# Patient Record
Sex: Female | Born: 1995 | Race: Black or African American | Hispanic: No | Marital: Single | Smoking: Never smoker
Health system: Southern US, Community
[De-identification: ages and names within clinical notes are randomized; demographics above are authoritative.]

## PROBLEM LIST (undated history)

## (undated) HISTORY — PX: SPINAL FUSION: SHX223

---

## 2018-07-18 ENCOUNTER — Emergency Department (HOSPITAL_BASED_OUTPATIENT_CLINIC_OR_DEPARTMENT_OTHER): Payer: BLUE CROSS/BLUE SHIELD

## 2018-07-18 ENCOUNTER — Emergency Department (HOSPITAL_BASED_OUTPATIENT_CLINIC_OR_DEPARTMENT_OTHER)
Admission: EM | Admit: 2018-07-18 | Discharge: 2018-07-19 | Disposition: A | Discharge status: Home / Self Care | Payer: BLUE CROSS/BLUE SHIELD | Attending: Emergency Medicine | Admitting: Emergency Medicine

## 2018-07-18 ENCOUNTER — Encounter (HOSPITAL_BASED_OUTPATIENT_CLINIC_OR_DEPARTMENT_OTHER): Payer: Self-pay | Admitting: *Deleted

## 2018-07-18 ENCOUNTER — Other Ambulatory Visit: Payer: Self-pay

## 2018-07-18 DIAGNOSIS — R000 Tachycardia, unspecified: Secondary | ICD-10-CM | POA: Diagnosis not present

## 2018-07-18 DIAGNOSIS — E860 Dehydration: Secondary | ICD-10-CM | POA: Diagnosis not present

## 2018-07-18 DIAGNOSIS — R002 Palpitations: Secondary | ICD-10-CM | POA: Diagnosis present

## 2018-07-18 LAB — URINALYSIS, ROUTINE W REFLEX MICROSCOPIC
BILIRUBIN URINE: NEGATIVE
GLUCOSE, UA: NEGATIVE mg/dL
HGB URINE DIPSTICK: NEGATIVE
Ketones, ur: >80 mg/dL — AB
Leukocytes, UA: NEGATIVE
Nitrite: NEGATIVE
Protein, ur: NEGATIVE mg/dL
SPECIFIC GRAVITY, URINE: 1.02 (ref 1.005–1.030)
pH: 6 (ref 5.0–8.0)

## 2018-07-18 LAB — COMPREHENSIVE METABOLIC PANEL
ALK PHOS: 52 U/L (ref 38–126)
ALT: 13 U/L (ref 0–44)
ANION GAP: 12 (ref 5–15)
AST: 21 U/L (ref 15–41)
Albumin: 4.6 g/dL (ref 3.5–5.0)
BUN: 13 mg/dL (ref 6–20)
CALCIUM: 9.2 mg/dL (ref 8.9–10.3)
CHLORIDE: 104 mmol/L (ref 98–111)
CO2: 21 mmol/L — ABNORMAL LOW (ref 22–32)
CREATININE: 0.81 mg/dL (ref 0.44–1.00)
Glucose, Bld: 110 mg/dL — ABNORMAL HIGH (ref 70–99)
Potassium: 3.4 mmol/L — ABNORMAL LOW (ref 3.5–5.1)
Sodium: 137 mmol/L (ref 135–145)
Total Bilirubin: 1 mg/dL (ref 0.3–1.2)
Total Protein: 8.3 g/dL — ABNORMAL HIGH (ref 6.5–8.1)

## 2018-07-18 LAB — CBC WITH DIFFERENTIAL/PLATELET
Basophils Absolute: 0 10*3/uL (ref 0.0–0.1)
Basophils Relative: 0 %
Eosinophils Absolute: 0 10*3/uL (ref 0.0–0.7)
Eosinophils Relative: 0 %
HCT: 36.2 % (ref 36.0–46.0)
Hemoglobin: 12.2 g/dL (ref 12.0–15.0)
LYMPHS ABS: 1.6 10*3/uL (ref 0.7–4.0)
LYMPHS PCT: 22 %
MCH: 29 pg (ref 26.0–34.0)
MCHC: 33.7 g/dL (ref 30.0–36.0)
MCV: 86 fL (ref 78.0–100.0)
MONOS PCT: 8 %
Monocytes Absolute: 0.6 10*3/uL (ref 0.1–1.0)
Neutro Abs: 5 10*3/uL (ref 1.7–7.7)
Neutrophils Relative %: 70 %
PLATELETS: 320 10*3/uL (ref 150–400)
RBC: 4.21 MIL/uL (ref 3.87–5.11)
RDW: 13.5 % (ref 11.5–15.5)
WBC: 7.2 10*3/uL (ref 4.0–10.5)

## 2018-07-18 LAB — MAGNESIUM: Magnesium: 2.1 mg/dL (ref 1.7–2.4)

## 2018-07-18 LAB — D-DIMER, QUANTITATIVE: D-Dimer, Quant: <0.27 ug/mL-FEU (ref 0.00–0.50)

## 2018-07-18 LAB — LIPASE, BLOOD: Lipase: 37 U/L (ref 11–51)

## 2018-07-18 LAB — TROPONIN I: Troponin I: <0.03 ng/mL (ref <0.03)

## 2018-07-18 LAB — PREGNANCY, URINE: Preg Test, Ur: NEGATIVE

## 2018-07-18 LAB — I-STAT CG4 LACTIC ACID, ED: LACTIC ACID, VENOUS: 1.86 mmol/L (ref 0.5–1.9)

## 2018-07-18 MED ORDER — IOPAMIDOL (ISOVUE-370) INJECTION 76%
100.0000 mL | Freq: Once | INTRAVENOUS | Status: AC | PRN
  Administered 2018-07-18: 75 mL via INTRAVENOUS

## 2018-07-18 MED ORDER — SODIUM CHLORIDE 0.9 % IV BOLUS
1000.0000 mL | Freq: Once | INTRAVENOUS | Status: AC
  Administered 2018-07-18: 1000 mL via INTRAVENOUS

## 2018-07-18 NOTE — ED Provider Notes (Signed)
[facility] EMERGENCY DEPARTMENT Provider Note   CSN: [id number] Arrival date & time: 07/18/18  2033     History   Chief Complaint Chief Complaint  Patient presents with  . Palpitations    HPI Brittany Hutchinson is a 22 y.o. female.  The history is provided by the patient and medical records. No language interpreter was used.  Palpitations   This is a new problem. The current episode started more than 2 days ago. The problem occurs constantly. The problem has not changed since onset.The problem is associated with an unknown factor. On average, each episode lasts 1 week. Associated symptoms include malaise/fatigue, nausea, back pain and leg pain. Pertinent negatives include no diaphoresis, no fever, no chest pain, no chest pressure, no exertional chest pressure, no irregular heartbeat, no near-syncope, no abdominal pain, no vomiting, no headaches, no lower extremity edema, no dizziness, no weakness, no cough, no shortness of breath and no sputum production. She has tried nothing for the symptoms.    History reviewed. No pertinent past medical history.  There are no active problems to display for this patient.   Past Surgical History:  Procedure Laterality Date  . SPINAL FUSION       OB History   None      Home Medications    Prior to Admission medications   Not on File    Family History No family history on file.  Social History Social History   Tobacco Use  . Smoking status: Never Smoker  . Smokeless tobacco: Never Used  Substance Use Topics  . Alcohol use: Not Currently  . Drug use: Never     Allergies   Patient has no known allergies.   Review of Systems Review of Systems  Constitutional: Positive for fatigue and malaise/fatigue. Negative for chills, diaphoresis and fever.  Respiratory: Negative for cough, sputum production, chest tightness, shortness of breath and stridor.   Cardiovascular: Positive for palpitations. Negative for chest  pain, leg swelling and near-syncope.  Gastrointestinal: Positive for nausea. Negative for abdominal pain, constipation, diarrhea and vomiting.  Genitourinary: Negative for dysuria (resolved recently) and flank pain.  Musculoskeletal: Positive for back pain. Negative for neck pain and neck stiffness.  Skin: Negative for rash and wound.  Neurological: Positive for light-headedness. Negative for dizziness, weakness and headaches.  Psychiatric/Behavioral: Negative for agitation.     Physical Exam Updated Vital Signs BP (!) 148/91 (BP Location: Left Arm)   Pulse (!) 112   Temp 99.2 F (37.3 C) (Oral)   Resp 19   Ht 5\' 4"  (1.626 m)   Wt 52.2 kg   LMP 07/04/2018   SpO2 98%   BMI 19.74 kg/m   Physical Exam  Constitutional: She is oriented to person, place, and time. She appears well-developed and well-nourished. No distress.  HENT:  Head: Normocephalic and atraumatic.  Mouth/Throat: Oropharynx is clear and moist. No oropharyngeal exudate.  Eyes: Pupils are equal, round, and reactive to light. Conjunctivae and EOM are normal.  Neck: Normal range of motion. Neck supple.  Cardiovascular: Regular rhythm. Tachycardia present.  No murmur heard. Pulmonary/Chest: Effort normal and breath sounds normal. No respiratory distress. She has no wheezes. She has no rales. She exhibits no tenderness.  Abdominal: Soft. There is no tenderness. There is no guarding.  Musculoskeletal: She exhibits no edema or tenderness.  Neurological: She is alert and oriented to person, place, and time. No sensory deficit. She exhibits normal muscle tone.  Skin: Skin is warm and dry. Capillary refill  takes less than 2 seconds. No rash noted. She is not diaphoretic. No erythema.  Psychiatric: She has a normal mood and affect.  Nursing note and vitals reviewed.    ED Treatments / Results  Labs (all labs ordered are listed, but only abnormal results are displayed) Labs Reviewed  COMPREHENSIVE METABOLIC PANEL -  Abnormal; Notable for the following components:      Result Value   Potassium 3.4 (*)    CO2 21 (*)    Glucose, Bld 110 (*)    Total Protein 8.3 (*)    All other components within normal limits  URINALYSIS, ROUTINE W REFLEX MICROSCOPIC - Abnormal; Notable for the following components:   Color, Urine AMBER (*)    APPearance HAZY (*)    Ketones, ur >80 (*)    All other components within normal limits  URINE CULTURE  PREGNANCY, URINE  CBC WITH DIFFERENTIAL/PLATELET  LIPASE, BLOOD  MAGNESIUM  TROPONIN I  D-DIMER, QUANTITATIVE (NOT AT [facility])  I-STAT CG4 LACTIC ACID, ED  I-STAT CG4 LACTIC ACID, ED    EKG EKG Interpretation  Date/Time:  Sunday July 18 2018 20:44:57 EDT Ventricular Rate:  140 PR Interval:  124 QRS Duration: 74 QT Interval:  356 QTC Calculation: 543 R Axis:   121 Text Interpretation:  Sinus tachycardia Right atrial enlargement Right axis deviation Possible Anterior infarct , age undetermined Abnormal ECG No prior ECG for comaprison.  No STEMI Confirmed by Theda Belfast ([id number]) on 07/18/2018 9:17:08 PM   Radiology Ct Angio Chest Pe W And/or Wo Contrast  Result Date: 07/18/2018 CLINICAL DATA:  22 year old female with palpitation. Concern for pulmonary embolism. EXAM: CT ANGIOGRAPHY CHEST WITH CONTRAST TECHNIQUE: Multidetector CT imaging of the chest was performed using the standard protocol during bolus administration of intravenous contrast. Multiplanar CT image reconstructions and MIPs were obtained to evaluate the vascular anatomy. CONTRAST:  76mL ISOVUE-370 IOPAMIDOL (ISOVUE-370) INJECTION 76% COMPARISON:  None. FINDINGS: Evaluation is limited due to streak artifact caused by spinal Harrington rod. Cardiovascular: There is no cardiomegaly or pericardial effusion. The thoracic aorta is unremarkable. The origins of the great vessels of the aortic arch are patent. There is no CT evidence of pulmonary embolism. Mediastinum/Nodes: There is no hilar or mediastinal  adenopathy. Esophagus and the thyroid gland are grossly unremarkable. No mediastinal fluid collection. Triangular soft tissue density in the anterior mediastinum most consistent with residual thymic tissue. Lungs/Pleura: Lungs are clear. No pleural effusion or pneumothorax. Upper Abdomen: No acute findings on limited evaluation due to streak artifact. Musculoskeletal: Posterior mid to lower thoracic fixation hardware. No acute osseous pathology. Review of the MIP images confirms the above findings. IMPRESSION: No acute intrathoracic pathology. No CT evidence of pulmonary embolism. Electronically Signed   By: Elgie Collard M.D.   On: 07/18/2018 23:02    Procedures Procedures (including critical care time)  Medications Ordered in ED Medications  sodium chloride 0.9 % bolus 1,000 mL (1,000 mLs Intravenous New Bag/Given 07/18/18 2211)  iopamidol (ISOVUE-370) 76 % injection 100 mL (75 mLs Intravenous Contrast Given 07/18/18 2216)     Initial Impression / Assessment and Plan / ED Course  I have reviewed the triage vital signs and the nursing notes.  Pertinent labs & imaging results that were available during my care of the patient were reviewed by me and considered in my medical decision making (see chart for details).     Brittany Hutchinson is a 22 y.o. female with a past medical history significant for prior spinal fusion who  presents with lightheaded, nausea, palpitations, right leg pain, and upper back pain.  Patient reports that she was diagnosed with UTI Thursday and started taking antibiotics.  She says that she was having dysuria previously but that has resolved.  She says that for the last few days she is been having palpitations and episodes of lightheadedness and nausea.  She says her heart feels like it has been racing.  She does report that she has had decreased oral intake and feels dehydrated with dry mouth.  She denies any chest pain or shortness of breath but does report her upper back is  been hurting all across the upper back.  She also says her right leg was hurting over the last few weeks and her primary doctor was scheduling her to get a DVT ultrasound next week.  She denies any history of DVT or PE.  She denies any smoking.  And she denies any estrogen use.  She denies other complaints on arrival.  Next  Initial EKG showed patient's heart rate was in the 130s on arrival.  Patient was witnessed to have heart rate into the 140s.  EKG showed a sinus tachycardia with no evidence of STEMI or arrhythmia.  No evidence of A. fib or a flutter.  Lungs were clear and chest was nontender.  Abdomen was nontender.  Legs were nonedematous and nontender.  Abdomen was nontender and patient appeared well other than her tachycardia.  Given the patient's report of right leg pain and her tachycardia with pain across her back, we were concerned about pulmonary embolism or DVT.  Ultrasound was unavailable and a d-dimer was ordered.  Given the high concern for PE, a PE study was ordered.  Laboratory testing was also ordered showing evidence of dehydration with ketones in her urine.  CT scan showed no PE and no pneumonia.  Labs were reassuring.  Patient's heart rate improved significantly after fluids.  Suspect dehydration was the primary component of her tachycardia.  Patient was feeling much better and is no longer felt dizzy or lightheaded.  Urinalysis did not show evidence of continued UTI and I suspect her antibiotic's are working.  Do not feel she is septic.    Given improvement in symptoms and her vital signs, patient was felt stable for discharge home.  Patient understands return precautions and plan of care.  Patient had no other questions or concerns and was discharged in good condition.   Final Clinical Impressions(s) / ED Diagnoses   Final diagnoses:  Palpitations  Tachycardia  Dehydration    ED Discharge Orders    None     Clinical Impression: 1. Palpitations   2. Tachycardia     3. Dehydration     Disposition: Discharge  Condition: Good  I have discussed the results, Dx and Tx plan with the pt(& family if present). He/she/they expressed understanding and agree(s) with the plan. Discharge instructions discussed at great length. Strict return precautions discussed and pt &/or family have verbalized understanding of the instructions. No further questions at time of discharge.    New Prescriptions   No medications on file    Follow Up: Center, [facility] [address] [place] [address] [phone]     [facility] [address] [id number] [address] [place] [address] [phone]       Antwoine Zorn, Canary Brim, MD 07/18/18 [phone]

## 2018-07-18 NOTE — Discharge Instructions (Signed)
Your work-up today was negative for blood clots in your legs or lungs.  Your lab work was consistent with dehydration.  We did not find other evidence of infection and your heart rate and symptoms resolved after fluids.  Please maintain hydration and follow-up with your primary doctor in several days.  If any symptoms change or worsen, please return to the nearest emergency department.

## 2018-07-18 NOTE — ED Triage Notes (Signed)
Palpitations x 1 week. Pt also c/o feeling dizzy and nauseated. C/o upper back pain and "my stomach feels balled up "

## 2018-07-18 NOTE — ED Triage Notes (Signed)
After triage pt states she was dx with UTI on Thursday and started taking antibiotics yesterday

## 2018-07-20 LAB — URINE CULTURE: CULTURE: NO GROWTH

## 2020-07-11 ENCOUNTER — Other Ambulatory Visit: Payer: Medicaid Other

## 2020-07-11 ENCOUNTER — Other Ambulatory Visit: Payer: Self-pay

## 2020-07-11 DIAGNOSIS — Z20822 Contact with and (suspected) exposure to covid-19: Secondary | ICD-10-CM

## 2020-07-12 ENCOUNTER — Ambulatory Visit (HOSPITAL_COMMUNITY)
Admission: EM | Admit: 2020-07-12 | Discharge: 2020-07-12 | Disposition: A | Discharge status: Home / Self Care | Payer: BC Managed Care – PPO

## 2020-07-12 ENCOUNTER — Encounter (HOSPITAL_COMMUNITY): Payer: Self-pay

## 2020-07-12 ENCOUNTER — Other Ambulatory Visit: Payer: Self-pay

## 2020-07-12 DIAGNOSIS — B9789 Other viral agents as the cause of diseases classified elsewhere: Secondary | ICD-10-CM

## 2020-07-12 DIAGNOSIS — J028 Acute pharyngitis due to other specified organisms: Secondary | ICD-10-CM | POA: Diagnosis not present

## 2020-07-12 LAB — SARS-COV-2, NAA 2 DAY TAT

## 2020-07-12 LAB — NOVEL CORONAVIRUS, NAA: SARS-CoV-2, NAA: NOT DETECTED

## 2020-07-12 MED ORDER — PREDNISONE 10 MG (21) PO TBPK: ORAL_TABLET | Freq: Every day | ORAL | 0 refills | Status: AC

## 2020-07-12 NOTE — ED Provider Notes (Signed)
[facility]    CSN: [id number] Arrival date & time: 07/12/20  1924      History   Chief Complaint Chief Complaint  Patient presents with   Dizziness   throat problem    HPI Brittany Hutchinson is a 24 y.o. female.   Pt here for sore / full ness in her throat. And intermit dizziness, states that she took a covid test 2 days prior and thinks it is positive but has not had the results yet. She took pecpid thinking this would help with the fullness feeling with no relief. Mild fever intermit. No chest pain or sob.      History reviewed. No pertinent past medical history.  There are no problems to display for this patient.   Past Surgical History:  Procedure Laterality Date   SPINAL FUSION      OB History   No obstetric history on file.      Home Medications    Prior to Admission medications   Medication Sig Start Date End Date Taking? Authorizing Provider  famotidine (PEPCID) 20 MG tablet Take 20 mg by mouth 2 (two) times daily.   Yes [provider]  predniSONE (STERAPRED UNI-PAK 21 TAB) 10 MG (21) TBPK tablet Take by mouth daily. Take 6 tabs by mouth daily  for 2 days, then 5 tabs for 2 days, then 4 tabs for 2 days, then 3 tabs for 2 days, 2 tabs for 2 days, then 1 tab by mouth daily for 2 days 07/12/20   Coralyn Mark, NP    Family History History reviewed. No pertinent family history.  Social History Social History   Tobacco Use   Smoking status: Never Smoker   Smokeless tobacco: Never Used  Building services engineer Use: Never used  Substance Use Topics   Alcohol use: Not Currently   Drug use: Never     Allergies   Patient has no known allergies.   Review of Systems Review of Systems  Constitutional: Positive for appetite change.  HENT: Positive for sore throat. Negative for congestion, postnasal drip, rhinorrhea and trouble swallowing.   Respiratory: Negative.   Cardiovascular: Negative.   Gastrointestinal: Negative.    Genitourinary: Negative.   Neurological: Positive for dizziness.     Physical Exam Triage Vital Signs ED Triage Vitals  Enc Vitals Group     BP 07/12/20 1931 132/70     Pulse Rate 07/12/20 1931 (!) 101     Resp 07/12/20 1931 16     Temp 07/12/20 1931 98.7 F (37.1 C)     Temp Source 07/12/20 1931 Oral     SpO2 07/12/20 1931 100 %     Weight --      Height --      Head Circumference --      Peak Flow --      Pain Score 07/12/20 1929 0     Pain Loc --      Pain Edu? --      Excl. in GC? --    No data found.  Updated Vital Signs BP 132/70 (BP Location: Right Arm)    Pulse (!) 101    Temp 98.7 F (37.1 C) (Oral)    Resp 16    LMP  (Within Weeks) Comment: 2 weeks   SpO2 100%   Visual Acuity     Physical Exam Constitutional:      Appearance: Normal appearance.  HENT:     Right Ear: Tympanic membrane  normal.     Left Ear: Tympanic membrane normal.     Nose: Nose normal. No congestion.     Mouth/Throat:     Mouth: Mucous membranes are moist.     Comments: Erythema  Eyes:     Pupils: Pupils are equal, round, and reactive to light.  Cardiovascular:     Rate and Rhythm: Normal rate.     Pulses: Normal pulses.  Abdominal:     General: Abdomen is flat.  Musculoskeletal:        General: Normal range of motion.     Cervical back: Normal range of motion.  Neurological:     General: No focal deficit present.     Mental Status: She is alert.      UC Treatments / Results  Labs (all labs ordered are listed, but only abnormal results are displayed) Labs Reviewed - No data to display  EKG   Radiology No results found.  Procedures Procedures (including critical care time)  Medications Ordered in UC Medications - No data to display  Initial Impression / Assessment and Plan / UC Course  I have reviewed the triage vital signs and the nursing notes.  Pertinent labs & imaging results that were available during my care of the patient were reviewed by me and  considered in my medical decision making (see chart for details).     Wait on covid results to return  Quarantine until then  Take motrin as needed  Final Clinical Impressions(s) / UC Diagnoses   Final diagnoses:  Sore throat (viral)     Discharge Instructions     Wait on covid results to return  Quarantine until then  Take motrin as needed      ED Prescriptions    Medication Sig Dispense Auth. Provider   predniSONE (STERAPRED UNI-PAK 21 TAB) 10 MG (21) TBPK tablet Take by mouth daily. Take 6 tabs by mouth daily  for 2 days, then 5 tabs for 2 days, then 4 tabs for 2 days, then 3 tabs for 2 days, 2 tabs for 2 days, then 1 tab by mouth daily for 2 days 42 tablet Coralyn Mark, NP     PDMP not reviewed this encounter.   Coralyn Mark, NP 07/16/20 [phone]

## 2020-07-12 NOTE — ED Triage Notes (Addendum)
Pt reports tightness in the throat since yesterday, lightheadedness since this afternoon. Pt denies SOB, trouble breathing. Pt reports Pepcid gives no relieve, as she though the tightness in the throat was related to acid reflux. Pt feels bigheaded when standing up.

## 2020-07-12 NOTE — Discharge Instructions (Addendum)
Wait on covid results to return  Quarantine until then  Take motrin as needed

## 2021-04-11 ENCOUNTER — Encounter (HOSPITAL_BASED_OUTPATIENT_CLINIC_OR_DEPARTMENT_OTHER): Payer: Self-pay | Admitting: Emergency Medicine

## 2021-04-11 ENCOUNTER — Emergency Department (HOSPITAL_BASED_OUTPATIENT_CLINIC_OR_DEPARTMENT_OTHER): Payer: BC Managed Care – PPO

## 2021-04-11 ENCOUNTER — Other Ambulatory Visit: Payer: Self-pay

## 2021-04-11 ENCOUNTER — Emergency Department (HOSPITAL_BASED_OUTPATIENT_CLINIC_OR_DEPARTMENT_OTHER)
Admission: EM | Admit: 2021-04-11 | Discharge: 2021-04-11 | Disposition: A | Discharge status: Home / Self Care | Payer: BC Managed Care – PPO | Attending: Emergency Medicine | Admitting: Emergency Medicine

## 2021-04-11 DIAGNOSIS — R109 Unspecified abdominal pain: Secondary | ICD-10-CM | POA: Diagnosis present

## 2021-04-11 DIAGNOSIS — R1013 Epigastric pain: Secondary | ICD-10-CM | POA: Diagnosis not present

## 2021-04-11 LAB — COMPREHENSIVE METABOLIC PANEL
ALT: 70 U/L — ABNORMAL HIGH (ref 0–44)
AST: 91 U/L — ABNORMAL HIGH (ref 15–41)
Albumin: 3.9 g/dL (ref 3.5–5.0)
Alkaline Phosphatase: 62 U/L (ref 38–126)
Anion gap: 8 (ref 5–15)
BUN: 9 mg/dL (ref 6–20)
CO2: 24 mmol/L (ref 22–32)
Calcium: 9 mg/dL (ref 8.9–10.3)
Chloride: 105 mmol/L (ref 98–111)
Creatinine, Ser: 0.69 mg/dL (ref 0.44–1.00)
GFR, Estimated: >60 mL/min (ref >60)
Glucose, Bld: 106 mg/dL — ABNORMAL HIGH (ref 70–99)
Potassium: 3.3 mmol/L — ABNORMAL LOW (ref 3.5–5.1)
Sodium: 137 mmol/L (ref 135–145)
Total Bilirubin: 0.3 mg/dL (ref 0.3–1.2)
Total Protein: 7.4 g/dL (ref 6.5–8.1)

## 2021-04-11 LAB — CBC WITH DIFFERENTIAL/PLATELET
Abs Immature Granulocytes: 0.01 10*3/uL (ref 0.00–0.07)
Basophils Absolute: 0 10*3/uL (ref 0.0–0.1)
Basophils Relative: 1 %
Eosinophils Absolute: 0 10*3/uL (ref 0.0–0.5)
Eosinophils Relative: 1 %
HCT: 30.6 % — ABNORMAL LOW (ref 36.0–46.0)
Hemoglobin: 9 g/dL — ABNORMAL LOW (ref 12.0–15.0)
Immature Granulocytes: 1 %
Lymphocytes Relative: 59 %
Lymphs Abs: 1.3 10*3/uL (ref 0.7–4.0)
MCH: 19.7 pg — ABNORMAL LOW (ref 26.0–34.0)
MCHC: 29.4 g/dL — ABNORMAL LOW (ref 30.0–36.0)
MCV: 67.1 fL — ABNORMAL LOW (ref 80.0–100.0)
Monocytes Absolute: 0.3 10*3/uL (ref 0.1–1.0)
Monocytes Relative: 12 %
Neutro Abs: 0.6 10*3/uL — ABNORMAL LOW (ref 1.7–7.7)
Neutrophils Relative %: 26 %
Platelets: 274 10*3/uL (ref 150–400)
RBC: 4.56 MIL/uL (ref 3.87–5.11)
RDW: 20.1 % — ABNORMAL HIGH (ref 11.5–15.5)
WBC: 2.2 10*3/uL — ABNORMAL LOW (ref 4.0–10.5)
nRBC: 0 % (ref 0.0–0.2)

## 2021-04-11 LAB — URINALYSIS, MICROSCOPIC (REFLEX)

## 2021-04-11 LAB — URINALYSIS, ROUTINE W REFLEX MICROSCOPIC
Bilirubin Urine: NEGATIVE
Glucose, UA: NEGATIVE mg/dL
Ketones, ur: NEGATIVE mg/dL
Leukocytes,Ua: NEGATIVE
Nitrite: NEGATIVE
Protein, ur: NEGATIVE mg/dL
Specific Gravity, Urine: 1.01 (ref 1.005–1.030)
pH: 7.5 (ref 5.0–8.0)

## 2021-04-11 LAB — LIPASE, BLOOD: Lipase: 43 U/L (ref 11–51)

## 2021-04-11 LAB — PREGNANCY, URINE: Preg Test, Ur: NEGATIVE

## 2021-04-11 MED ORDER — ONDANSETRON HCL 4 MG/2ML IJ SOLN
4.0000 mg | Freq: Once | INTRAMUSCULAR | Status: AC
  Administered 2021-04-11: 4 mg via INTRAVENOUS
  Filled 2021-04-11: qty 2

## 2021-04-11 MED ORDER — DICYCLOMINE HCL 20 MG PO TABS: 20.0000 mg | ORAL_TABLET | Freq: Three times a day (TID) | ORAL | 0 refills | Status: AC | PRN

## 2021-04-11 MED ORDER — SODIUM CHLORIDE 0.9 % IV BOLUS
1000.0000 mL | Freq: Once | INTRAVENOUS | Status: AC
  Administered 2021-04-11: 1000 mL via INTRAVENOUS

## 2021-04-11 MED ORDER — ONDANSETRON 4 MG PO TBDP: 4.0000 mg | ORAL_TABLET | Freq: Three times a day (TID) | ORAL | 0 refills | Status: DC | PRN

## 2021-04-11 MED ORDER — ONDANSETRON 4 MG PO TBDP: 4.0000 mg | ORAL_TABLET | Freq: Three times a day (TID) | ORAL | 0 refills | Status: AC | PRN

## 2021-04-11 MED ORDER — IOHEXOL 9 MG/ML PO SOLN: 500.0000 mL | ORAL | Status: AC

## 2021-04-11 MED ORDER — DICYCLOMINE HCL 20 MG PO TABS: 20.0000 mg | ORAL_TABLET | Freq: Three times a day (TID) | ORAL | 0 refills | Status: DC | PRN

## 2021-04-11 NOTE — ED Notes (Signed)
Pt states the oral contrast is making her nauseated  Spoke with EDP and he states it is okay to perform scan without it  Pt also states that she tested positive for Covid on Saturday 5/21

## 2021-04-11 NOTE — ED Notes (Signed)
Pt is in CT

## 2021-04-11 NOTE — Discharge Instructions (Signed)

## 2021-04-11 NOTE — ED Triage Notes (Signed)
Pt is c/o abd pain that started tonight  Pt is c/o nausea without vomiting  Denies diarrhea  Pt states she took some pepto at home without relief  Pt states the pain is mainly around the umbilical area

## 2021-04-11 NOTE — ED Provider Notes (Signed)
Blood pressure (!) 92/54, pulse 71, temperature 99.1 F (37.3 C), temperature source Oral, resp. rate 14, height 5\' 4"  (1.626 m), weight 50.3 kg, last menstrual period 04/08/2021, SpO2 100 %.  Assuming care from Dr. 04/10/2021.  In short, Brittany Hutchinson is a 24 y.o. female with a chief complaint of Abdominal Pain .  Refer to the original H&P for additional details.  The current plan of care is to obtain f/u 30.   08:57 AM  Ultrasound reviewed.  No acute findings.  Patient is feeling much better.  Plan for discharge with supportive care medications and PCP follow-up.  Discussed ED return precautions.    [place], MD 04/11/21 [phone]

## 2021-04-11 NOTE — ED Notes (Signed)
Warm blankets given.

## 2021-04-11 NOTE — ED Provider Notes (Signed)
[facility] EMERGENCY DEPARTMENT Provider Note   CSN: [id number] Arrival date & time: 04/11/21  0249     History Chief Complaint  Patient presents with  . Abdominal Pain    Brittany Hutchinson is a 25 y.o. female.  Patient presents to the emergency department for evaluation of abdominal pain.  Patient has been experiencing mid abdominal pain for 1 day.  Pain is mostly just above her umbilicus.  She feels nauseated but no vomiting or diarrhea.  Denies urinary symptoms and gynecologic symptoms.  She was diagnosed with COVID about a week ago.        History reviewed. No pertinent past medical history.  There are no problems to display for this patient.   Past Surgical History:  Procedure Laterality Date  . SPINAL FUSION       OB History   No obstetric history on file.     History reviewed. No pertinent family history.  Social History   Tobacco Use  . Smoking status: Never Smoker  . Smokeless tobacco: Never Used  Vaping Use  . Vaping Use: Never used  Substance Use Topics  . Alcohol use: Not Currently  . Drug use: Never    Home Medications Prior to Admission medications   Medication Sig Start Date End Date Taking? Authorizing Provider  famotidine (PEPCID) 20 MG tablet Take 20 mg by mouth 2 (two) times daily.    [provider]  predniSONE (STERAPRED UNI-PAK 21 TAB) 10 MG (21) TBPK tablet Take by mouth daily. Take 6 tabs by mouth daily  for 2 days, then 5 tabs for 2 days, then 4 tabs for 2 days, then 3 tabs for 2 days, 2 tabs for 2 days, then 1 tab by mouth daily for 2 days 07/12/20   Coralyn Mark, NP    Allergies    Patient has no known allergies.  Review of Systems   Review of Systems  Gastrointestinal: Positive for abdominal pain and nausea.  All other systems reviewed and are negative.   Physical Exam Updated Vital Signs BP 124/68 (BP Location: Right Arm)   Pulse 88   Temp 99.1 F (37.3 C) (Oral)   Resp 16   Ht 5\' 4"  (1.626  m)   Wt 50.3 kg   LMP 04/08/2021 (Exact Date)   SpO2 100%   BMI 19.05 kg/m   Physical Exam Vitals and nursing note reviewed.  Constitutional:      General: She is not in acute distress.    Appearance: Normal appearance. She is well-developed.  HENT:     Head: Normocephalic and atraumatic.     Right Ear: Hearing normal.     Left Ear: Hearing normal.     Nose: Nose normal.  Eyes:     Conjunctiva/sclera: Conjunctivae normal.     Pupils: Pupils are equal, round, and reactive to light.  Cardiovascular:     Rate and Rhythm: Regular rhythm.     Heart sounds: S1 normal and S2 normal. No murmur heard. No friction rub. No gallop.   Pulmonary:     Effort: Pulmonary effort is normal. No respiratory distress.     Breath sounds: Normal breath sounds.  Chest:     Chest wall: No tenderness.  Abdominal:     General: Bowel sounds are normal.     Palpations: Abdomen is soft.     Tenderness: There is abdominal tenderness in the epigastric area. There is no guarding or rebound. Negative signs include Murphy's sign and McBurney's  sign.     Hernia: No hernia is present.  Musculoskeletal:        General: Normal range of motion.     Cervical back: Normal range of motion and neck supple.  Skin:    General: Skin is warm and dry.     Findings: No rash.  Neurological:     Mental Status: She is alert and oriented to person, place, and time.     GCS: GCS eye subscore is 4. GCS verbal subscore is 5. GCS motor subscore is 6.     Cranial Nerves: No cranial nerve deficit.     Sensory: No sensory deficit.     Coordination: Coordination normal.  Psychiatric:        Speech: Speech normal.        Behavior: Behavior normal.        Thought Content: Thought content normal.     ED Results / Procedures / Treatments   Labs (all labs ordered are listed, but only abnormal results are displayed) Labs Reviewed  CBC WITH DIFFERENTIAL/PLATELET - Abnormal; Notable for the following components:      Result  Value   WBC 2.2 (*)    Hemoglobin 9.0 (*)    HCT 30.6 (*)    MCV 67.1 (*)    MCH 19.7 (*)    MCHC 29.4 (*)    RDW 20.1 (*)    Neutro Abs 0.6 (*)    All other components within normal limits  COMPREHENSIVE METABOLIC PANEL - Abnormal; Notable for the following components:   Potassium 3.3 (*)    Glucose, Bld 106 (*)    AST 91 (*)    ALT 70 (*)    All other components within normal limits  URINALYSIS, ROUTINE W REFLEX MICROSCOPIC - Abnormal; Notable for the following components:   Hgb urine dipstick MODERATE (*)    All other components within normal limits  URINALYSIS, MICROSCOPIC (REFLEX) - Abnormal; Notable for the following components:   Bacteria, UA RARE (*)    All other components within normal limits  LIPASE, BLOOD  PREGNANCY, URINE    EKG None  Radiology CT ABDOMEN PELVIS WO CONTRAST  Result Date: 04/11/2021 CLINICAL DATA:  Nonlocalized acute abdominal pain. EXAM: CT ABDOMEN AND PELVIS WITHOUT CONTRAST TECHNIQUE: Multidetector CT imaging of the abdomen and pelvis was performed following the standard protocol without IV contrast. COMPARISON:  None. FINDINGS: Limited evaluation due to streak artifact originating from the thoracolumbar spine surgical hardware. Lower chest: No acute abnormality. Hepatobiliary: No focal liver abnormality. No gallstones, gallbladder wall thickening, or pericholecystic fluid. No biliary dilatation. Pancreas: No focal lesion. Normal pancreatic contour. No surrounding inflammatory changes. No main pancreatic ductal dilatation. Spleen: Normal in size without focal abnormality. Adrenals/Urinary Tract: No adrenal nodule bilaterally. No nephrolithiasis, no hydronephrosis, and no contour-deforming renal mass. No ureterolithiasis or hydroureter. The urinary bladder is unremarkable. Stomach/Bowel: PO contrast reaches the mid small bowel. Stomach is within normal limits. No evidence of bowel wall thickening or dilatation. Appendix appears normal.  Vascular/Lymphatic: No significant vascular findings are present. No enlarged abdominal or pelvic lymph nodes. Reproductive: Uterus and bilateral adnexa are unremarkable. Other: No intraperitoneal free fluid. No intraperitoneal free gas. No organized fluid collection. Musculoskeletal: No abdominal wall hernia or abnormality. No suspicious lytic or blastic osseous lesions. No acute displaced fracture. Thoracolumbar scoliosis with partially visualized posterior thoracolumbar spine surgical hardware. No CT finding of surgical hardware complication. IMPRESSION: No acute intra-abdominal or intrapelvic abnormality. Please note markedly limited evaluation of the  lower thorax and upper mid abdomen due to streak artifact originating from the thoracolumbar surgical hardware. Limited evaluation due to noncontrast study. Electronically Signed   By: Tish Frederickson M.D.   On: 04/11/2021 05:27    Procedures Procedures   Medications Ordered in ED Medications  iohexol (OMNIPAQUE) 9 MG/ML oral solution 500 mL (has no administration in time range)  ondansetron (ZOFRAN) injection 4 mg (4 mg Intravenous Given 04/11/21 0317)  sodium chloride 0.9 % bolus 1,000 mL ( Intravenous Stopped 04/11/21 0420)    ED Course  I have reviewed the triage vital signs and the nursing notes.  Pertinent labs & imaging results that were available during my care of the patient were reviewed by me and considered in my medical decision making (see chart for details).    MDM Rules/Calculators/A&P                          Patient presents with abdominal pain.  Symptoms present for 1 day.  Pain is central abdomen, just above the umbilicus.  No tenderness or pain below the umbilicus.  Negative tenderness at McBurney's point.  No specific right upper quadrant tenderness but her LFTs are slightly elevated.  Remainder of lab work was unremarkable.  Patient underwent CT scan to further evaluate.  Unfortunately spinal hardware caused significant  amount of artifact, therefore cannot see lower thorax and upper abdomen well.  No problems in the lower abdomen and pelvis.  Will perform ultrasound to evaluate liver and gallbladder. Signed out to oncoming ER physician to follow up US.  Final Clinical Impression(s) / ED Diagnoses Final diagnoses:  Epigastric pain    Rx / DC Orders ED Discharge Orders    None       Leam Madero, Canary Brim, MD 04/11/21 [id number]

## 2021-04-12 LAB — PATHOLOGIST SMEAR REVIEW

## 2021-05-28 ENCOUNTER — Other Ambulatory Visit: Payer: Self-pay | Admitting: Physician Assistant

## 2021-05-28 DIAGNOSIS — N6320 Unspecified lump in the left breast, unspecified quadrant: Secondary | ICD-10-CM

## 2021-06-14 ENCOUNTER — Other Ambulatory Visit: Payer: Self-pay

## 2021-06-14 ENCOUNTER — Other Ambulatory Visit: Payer: Self-pay | Admitting: Physician Assistant

## 2021-06-14 ENCOUNTER — Ambulatory Visit
Admission: RE | Admit: 2021-06-14 | Discharge: 2021-06-14 | Disposition: A | Discharge status: Home / Self Care | Payer: BC Managed Care – PPO | Source: Ambulatory Visit | Attending: Physician Assistant | Admitting: Physician Assistant

## 2021-06-14 DIAGNOSIS — N6320 Unspecified lump in the left breast, unspecified quadrant: Secondary | ICD-10-CM

## 2021-06-20 ENCOUNTER — Ambulatory Visit
Admission: RE | Admit: 2021-06-20 | Discharge: 2021-06-20 | Disposition: A | Discharge status: Home / Self Care | Payer: BC Managed Care – PPO | Source: Ambulatory Visit | Attending: Physician Assistant | Admitting: Physician Assistant

## 2021-06-20 ENCOUNTER — Other Ambulatory Visit: Payer: Self-pay

## 2021-06-20 ENCOUNTER — Other Ambulatory Visit: Payer: Self-pay | Admitting: Physician Assistant

## 2021-06-20 DIAGNOSIS — N6320 Unspecified lump in the left breast, unspecified quadrant: Secondary | ICD-10-CM

## 2022-02-11 IMAGING — US US ABDOMEN LIMITED
1 series · 14 of 25 positions shown · non-contrast
Comparison: No prior.

CLINICAL DATA: Abdominal pain.

EXAM:
ULTRASOUND ABDOMEN LIMITED RIGHT UPPER QUADRANT

[Series 1: us abdomen limited · 14 of 32 slices shown]
[im 1/32]
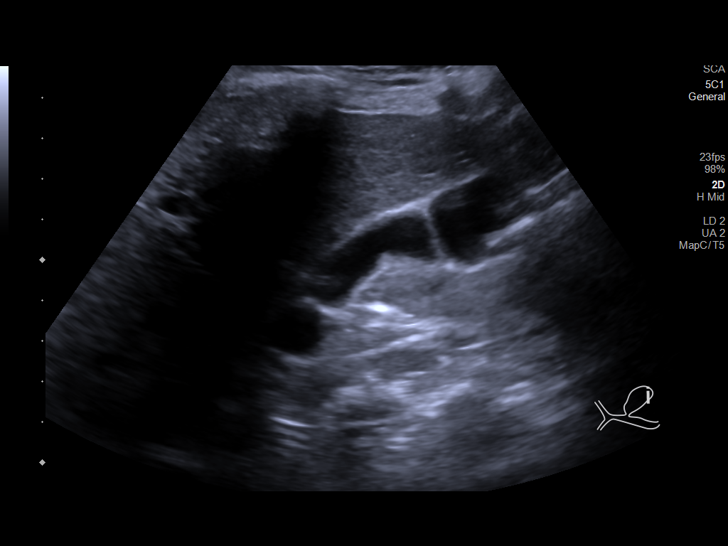
[im 3/32]
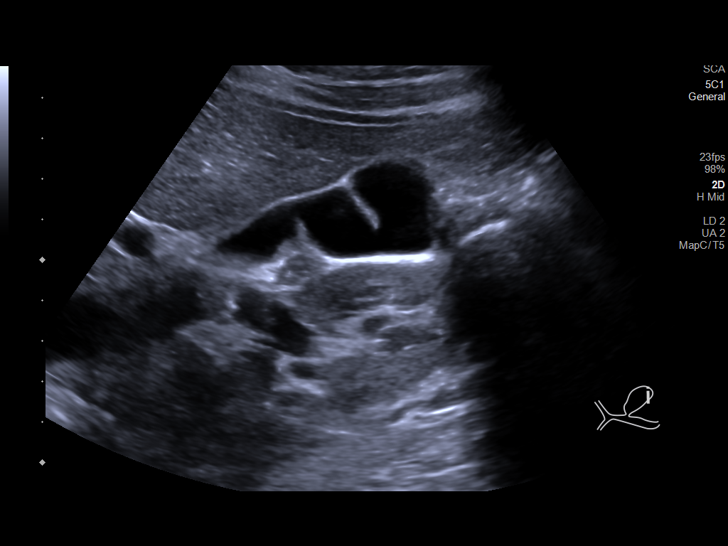
[im 6/32]
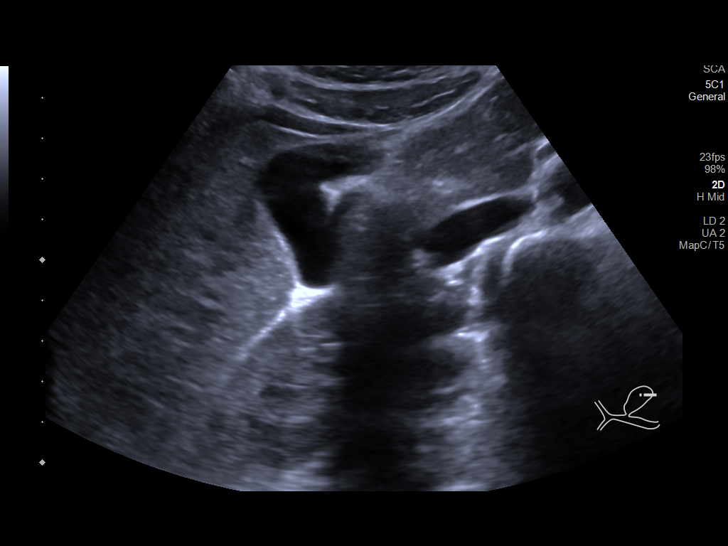
[im 8/32]
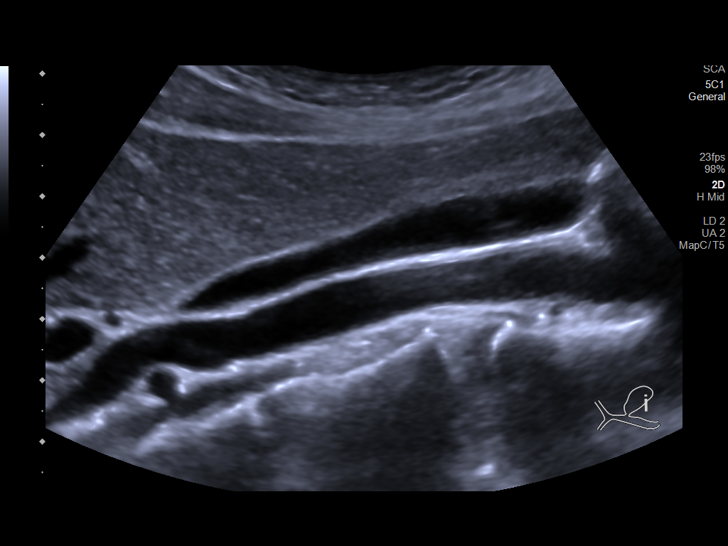
[im 11/32]
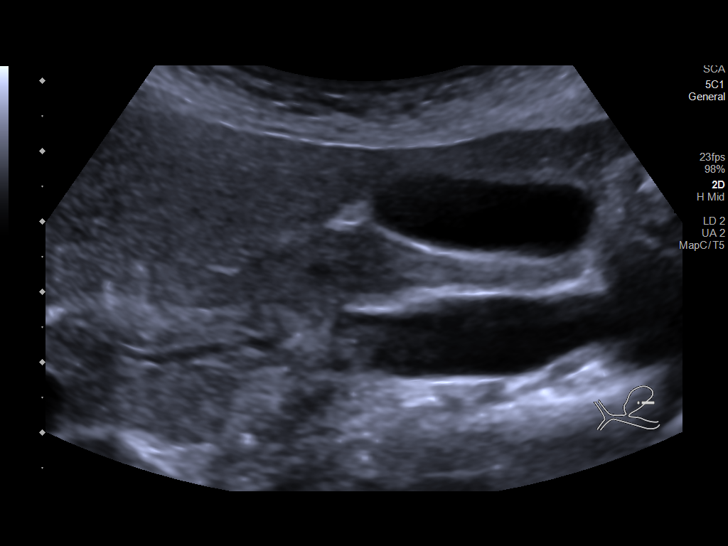
[im 12/32]
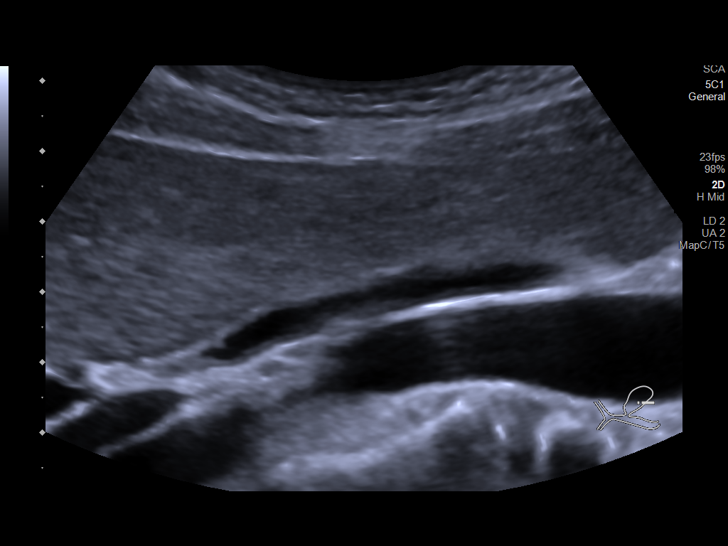
[im 15/32]
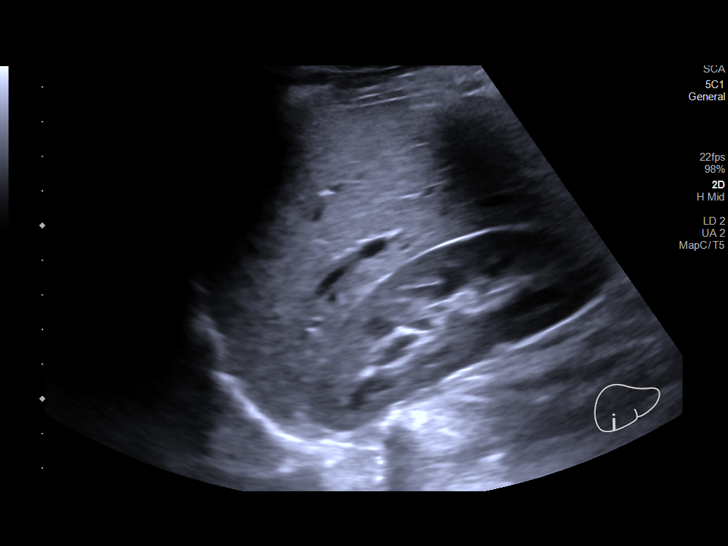
[im 17/32]
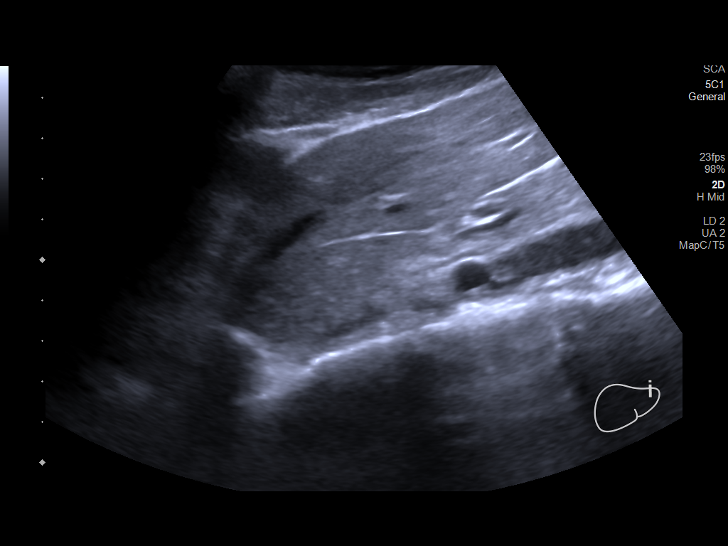
[im 20/32]
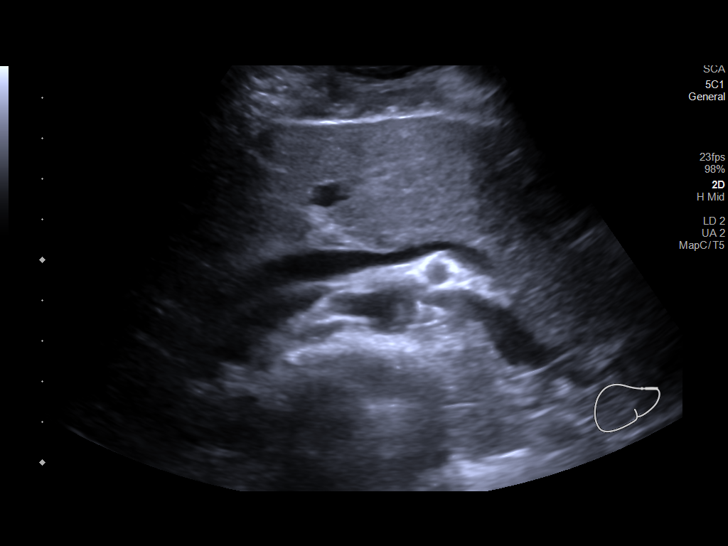
[im 21/32]
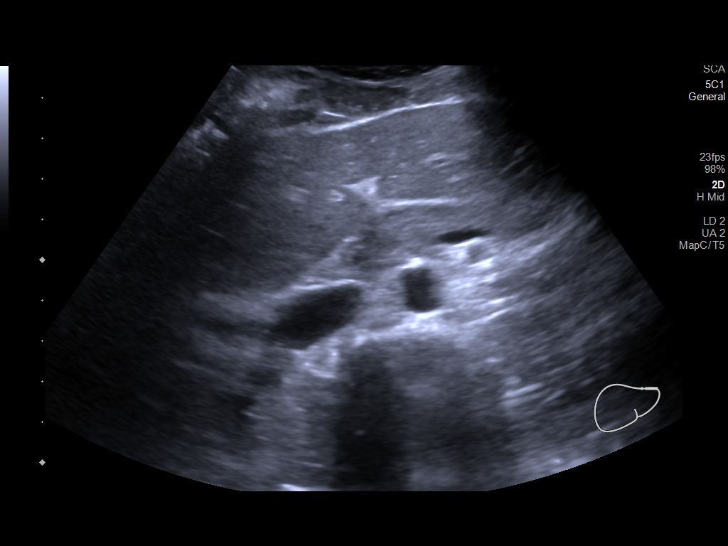
[im 24/32]
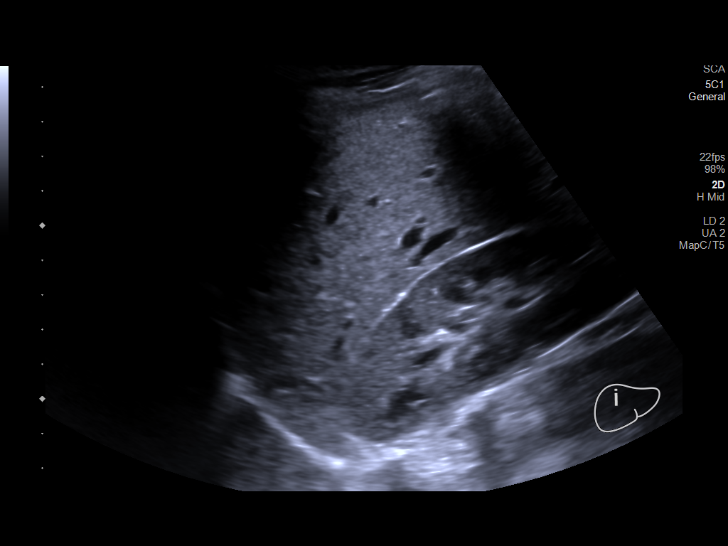
[im 26/32]
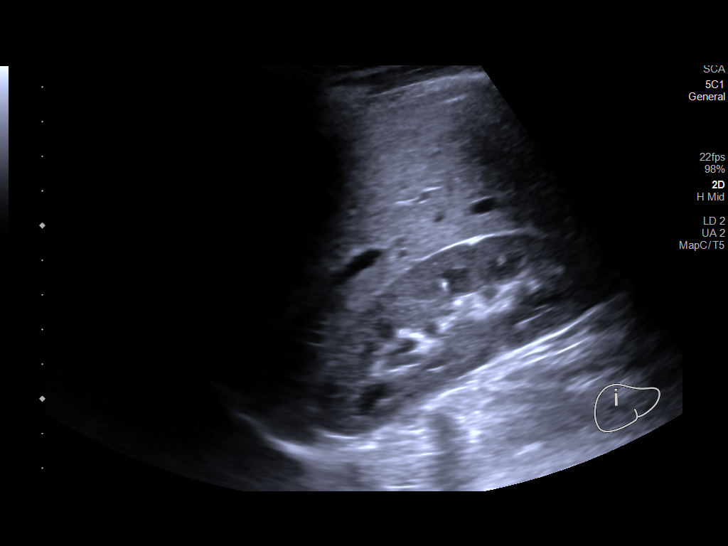
[im 29/32]
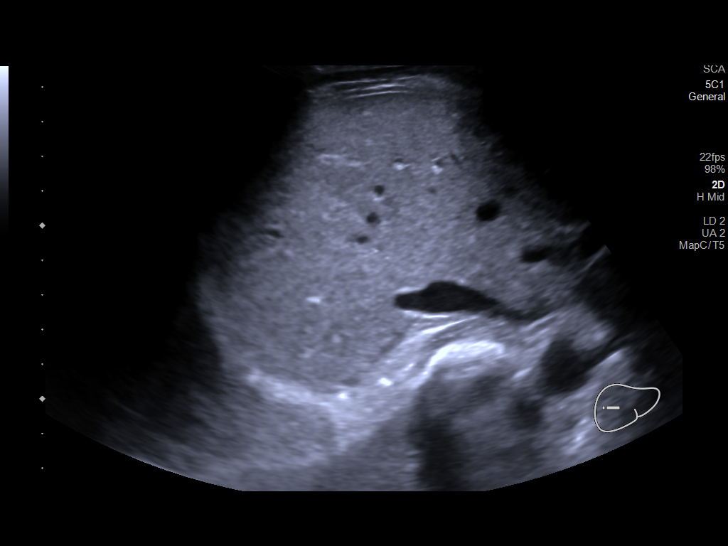
[im 32/32]
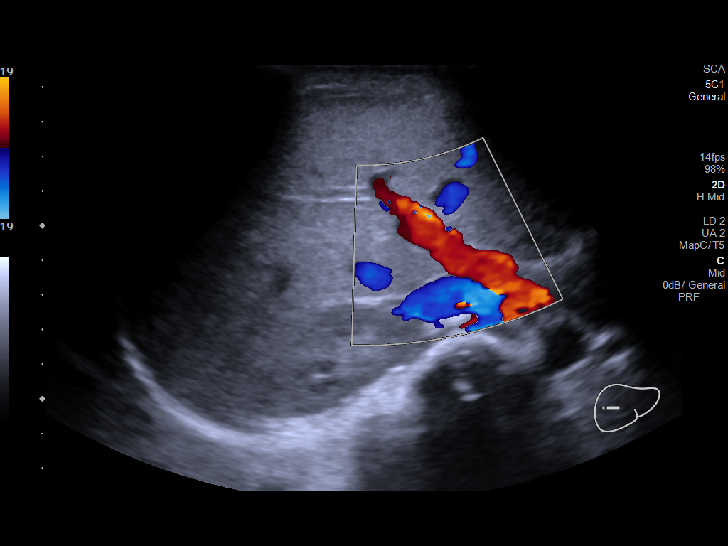

[14 of 25 positions shown; findings below may reference images not displayed]

FINDINGS: Gallbladder:

No gallstones or wall thickening visualized. No sonographic Murphy
sign noted by sonographer.

Common bile duct:

Diameter: 1.8 mm

Liver:

No focal lesion identified. Within normal limits in parenchymal
echogenicity. Portal vein is patent on color Doppler imaging with
normal direction of blood flow towards the liver.

Other: None.
IMPRESSION: No gallstones or biliary distention. No acute abnormality
identified.

## 2022-04-16 IMAGING — US US BREAST*L* LIMITED INC AXILLA
1 series · 9 of 9 positions shown · non-contrast
Comparison: None.

CLINICAL DATA: 25-year-old female with a previously palpable left
breast lump which has since resolved. Additionally, the patient
reports 2 episodes of spontaneous, yellow left nipple discharge.

EXAM:
ULTRASOUND OF THE LEFT BREAST

[Series 1: us breast*left* limited inc axilla · 0.06mm/px · 9 of 9 slices shown]
[im 1/9]
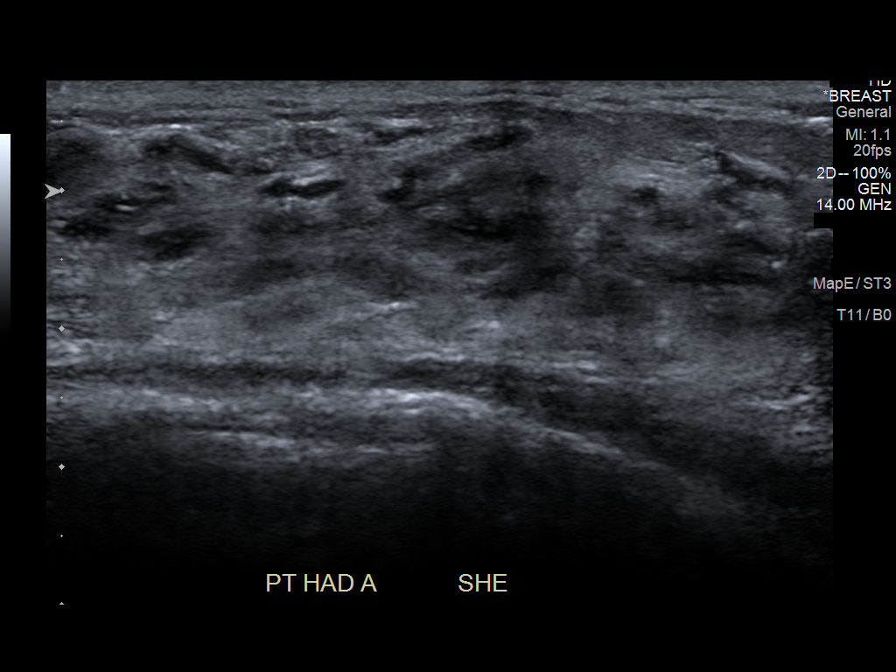
[im 2/9]
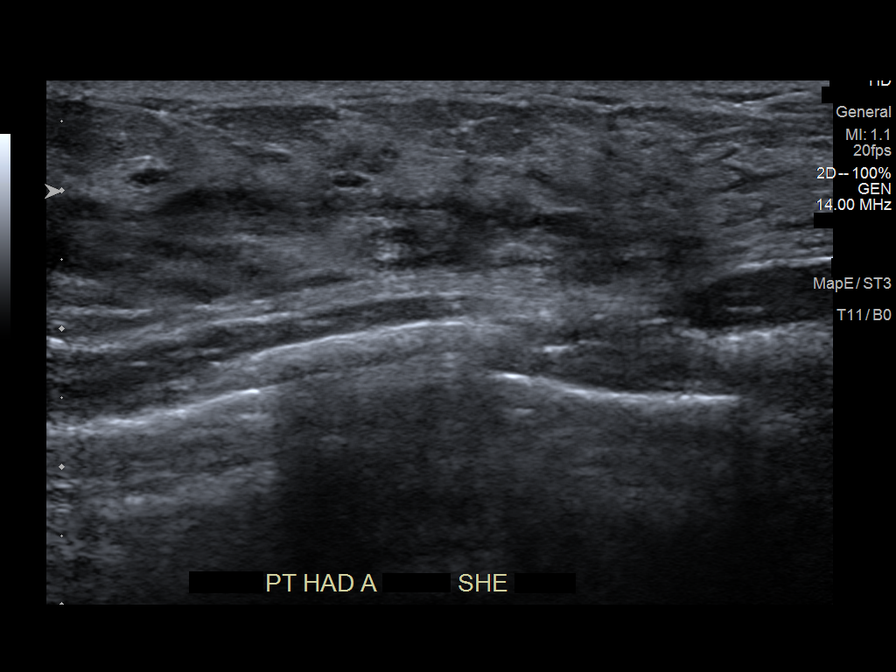
[im 3/9]
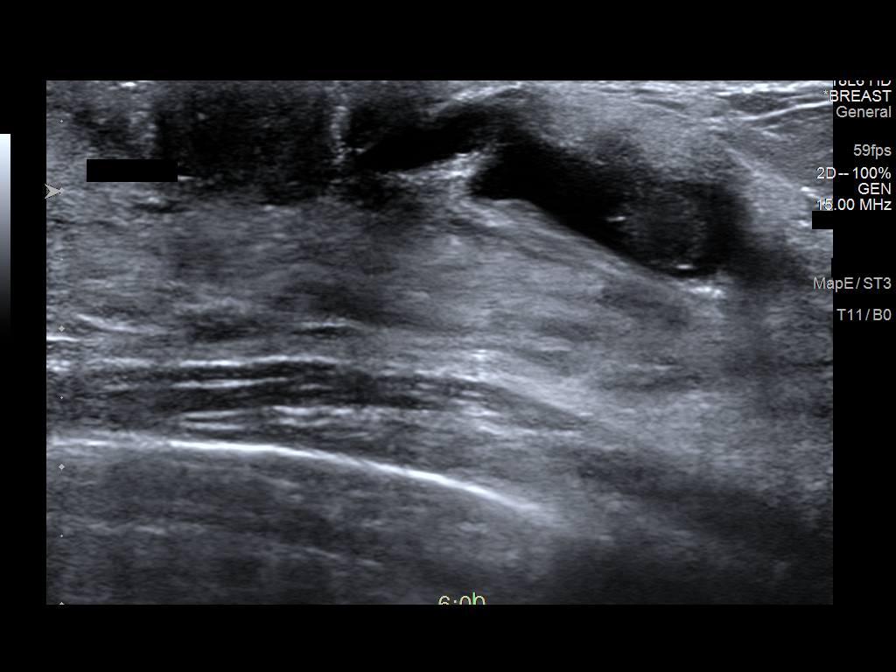
[im 4/9]
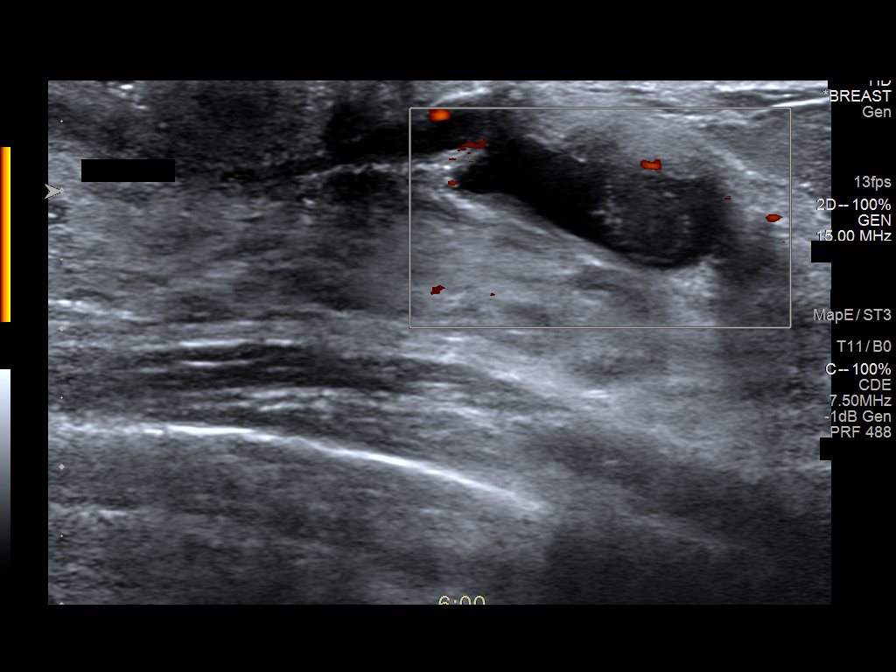
[im 5/9]
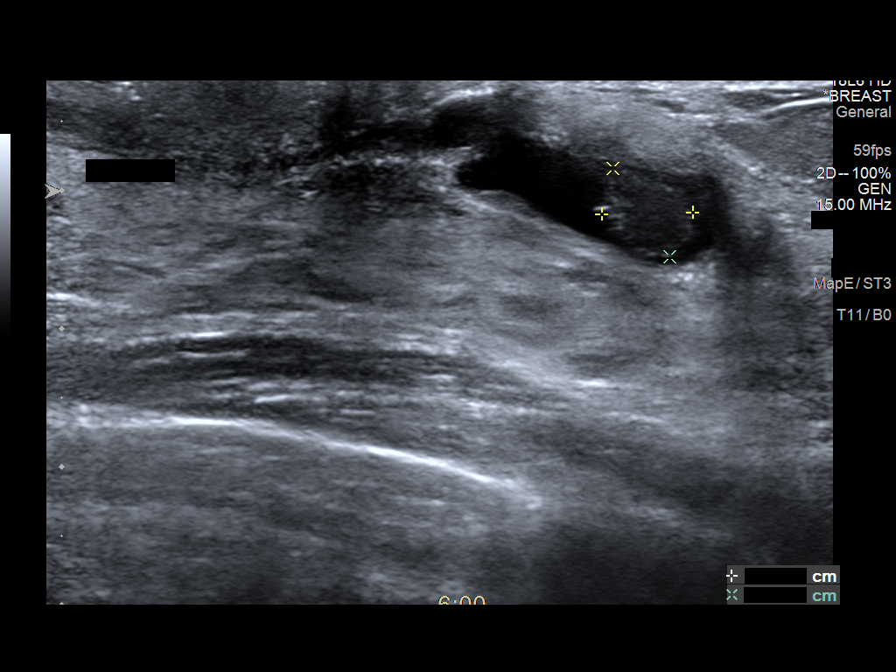
[im 6/9]
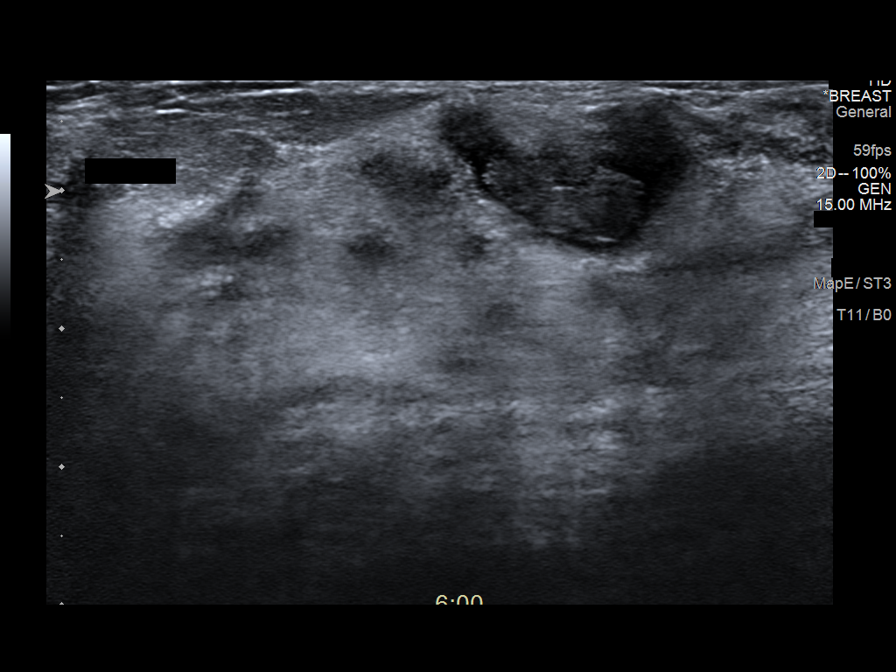
[im 7/9]
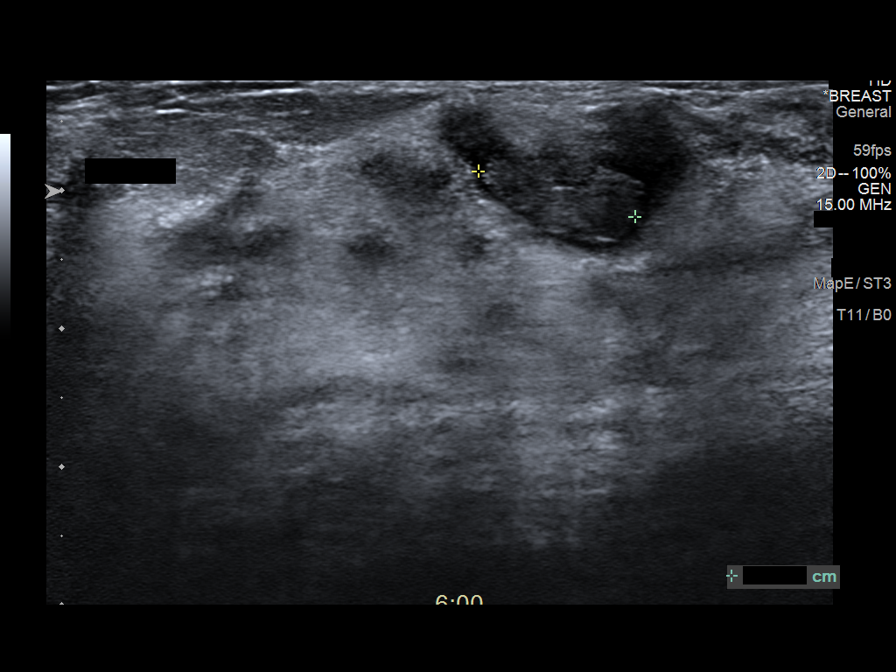
[im 8/9]
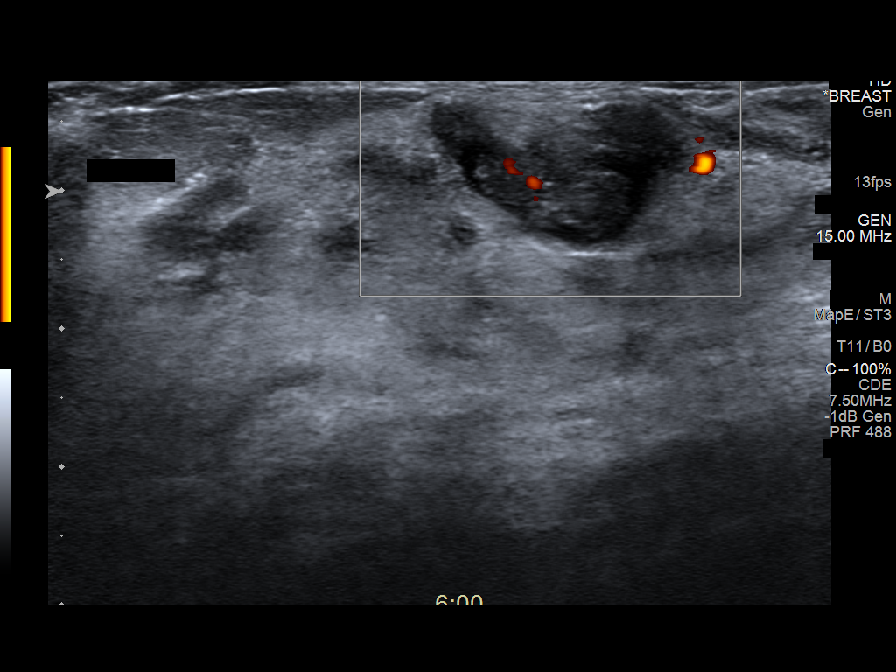
[im 9/9]
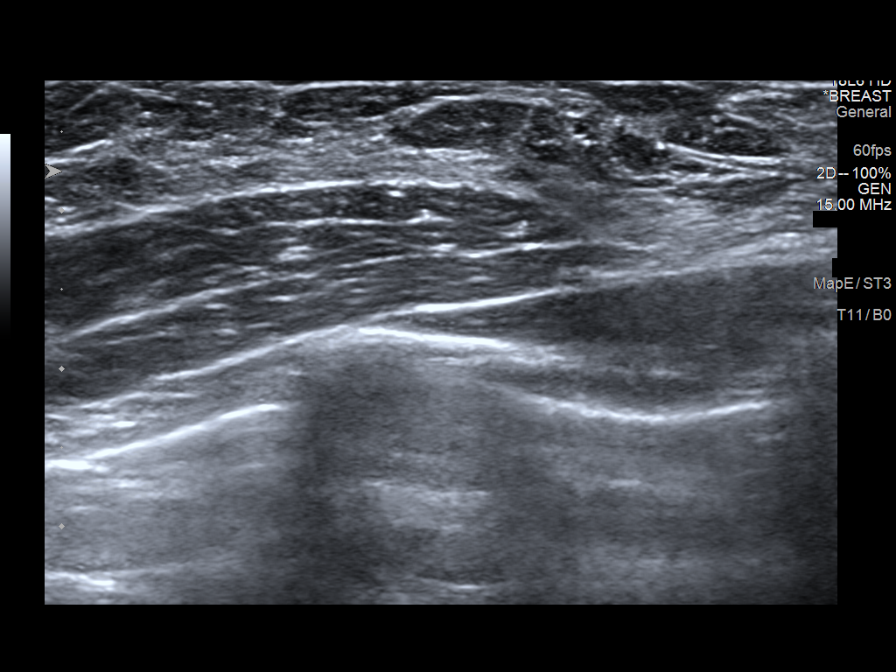

[9 of 9 positions shown; findings below may reference images not displayed]

FINDINGS: Targeted ultrasound is performed, showing normal fibroglandular
tissue without focal or suspicious sonographic abnormality along the
4 o'clock axis at the site of the patient's palpable lump.
Evaluation in the retroareolar left breast demonstrates a single
dilated duct with an internal oval, circumscribed hypoechoic mass
and associated vascularity along the 6 o'clock position. It measures
1.2 x 0.8 x 0.7 cm. Evaluation of the left axilla demonstrates no
suspicious lymphadenopathy.
IMPRESSION: 1. Indeterminate left breast intraductal mass measuring up to
cm. Recommendation is for ultrasound-guided biopsy.
2. No suspicious right axillary lymphadenopathy.
3. No suspicious findings at the site of the patient's previous left
breast palpable lump.

RECOMMENDATION:
Ultrasound-guided biopsy of the left breast.

I have discussed the findings and recommendations with the patient.
If applicable, a reminder letter will be sent to the patient
regarding the next appointment.

BI-RADS CATEGORY  4: Suspicious.

## 2022-04-22 IMAGING — US US BREAST BX W LOC DEV 1ST LESION IMG BX SPEC US GUIDE*L*
1 series · 8 of 8 positions shown · non-contrast
Comparison: Previous exam(s).
COMPARISON: Previous exam(s).

Addendum:
CLINICAL DATA: 25-year-old female with an indeterminate intraductal
mass in the left breast.

EXAM:
ULTRASOUND GUIDED LEFT BREAST CORE NEEDLE BIOPSY

[Series 1: us breast bx w loc dev 1st lesion img bx spec us g · 0.06mm/px · 8 of 8 slices shown]
[im 1/8]
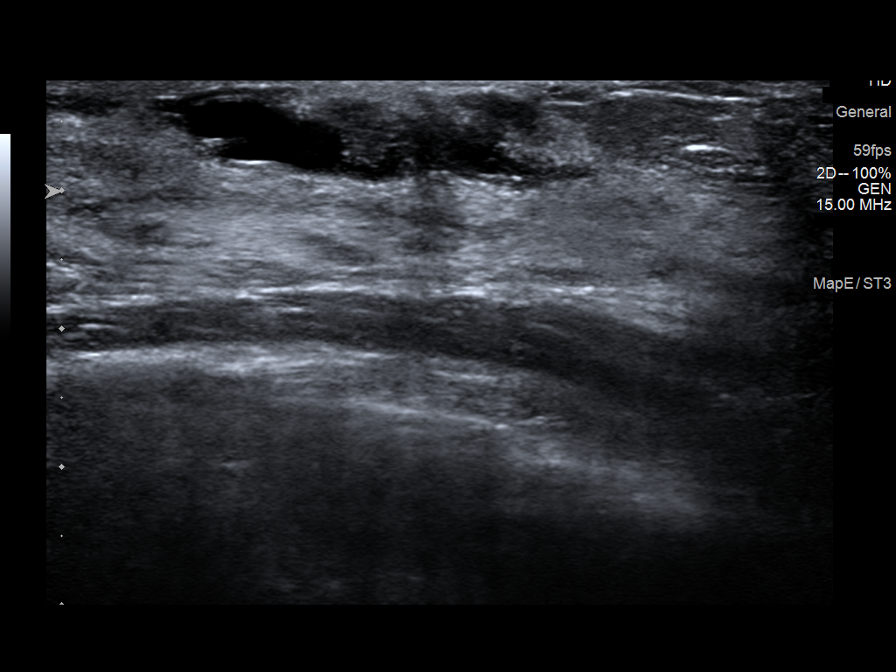
[im 2/8]
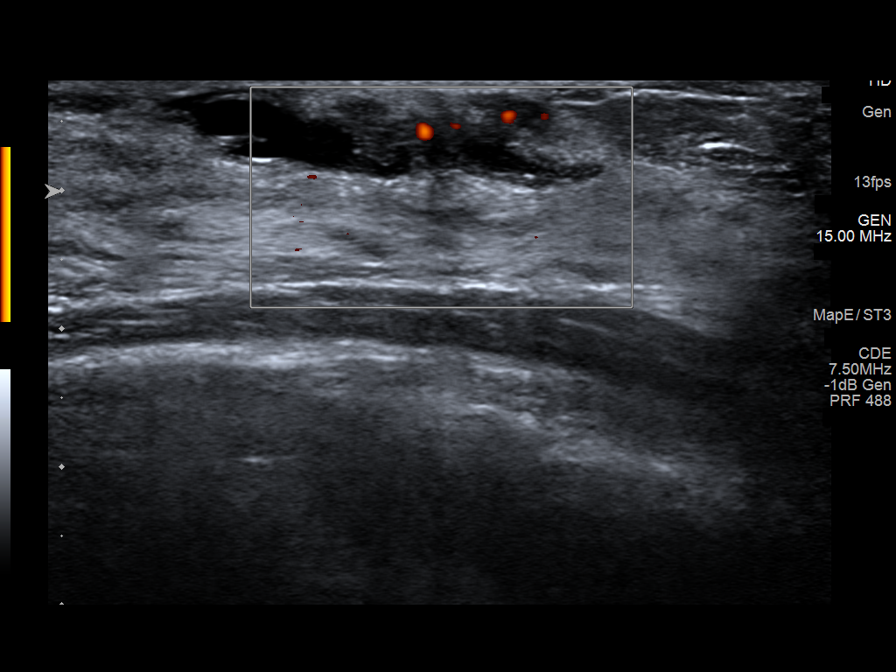
[im 3/8]
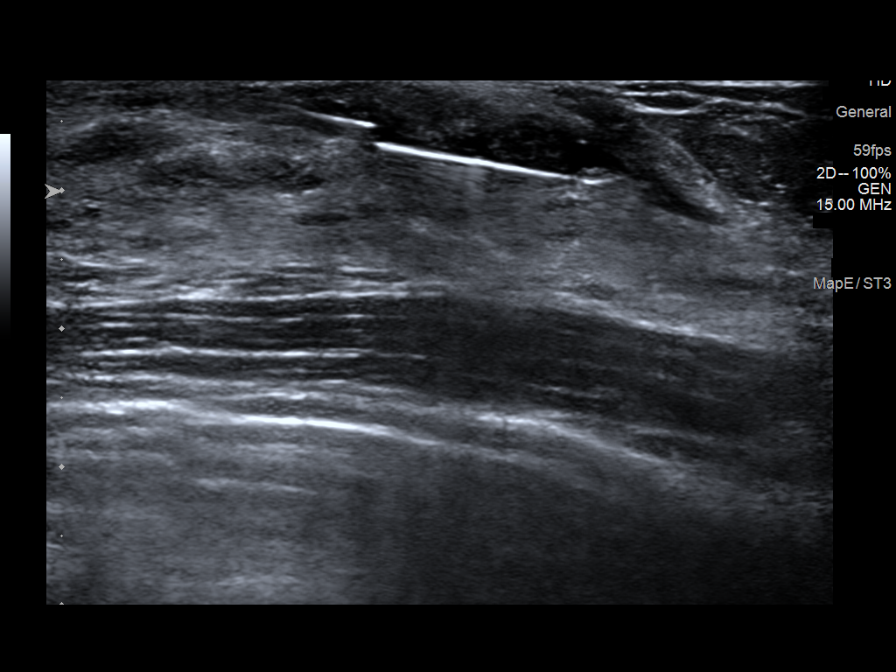
[im 4/8]
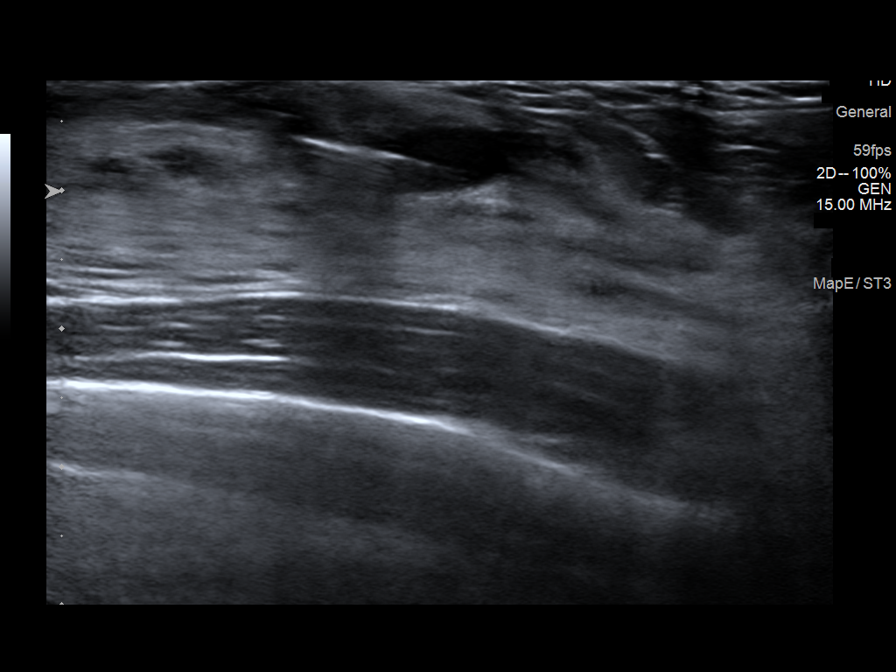
[im 5/8]
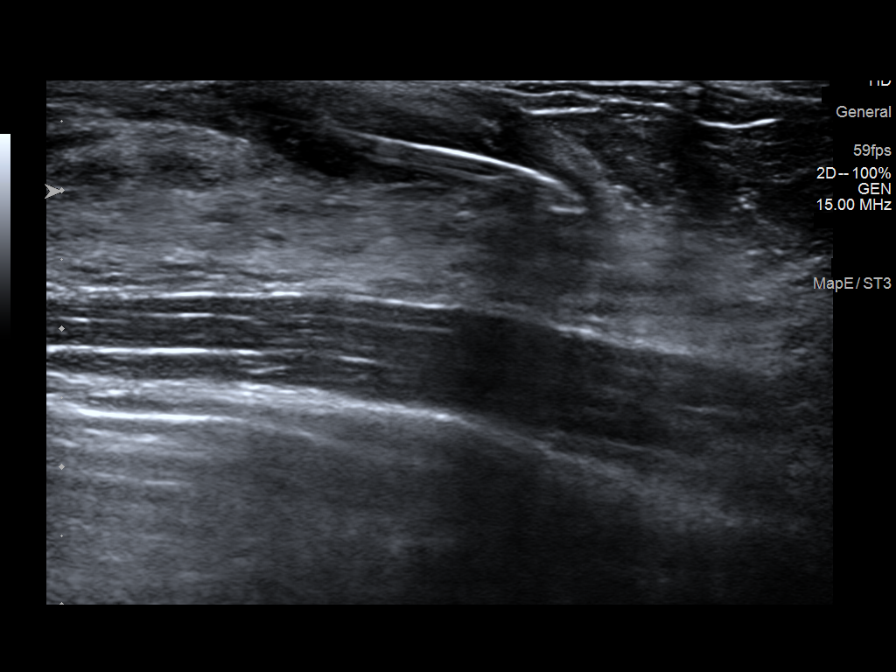
[im 6/8]
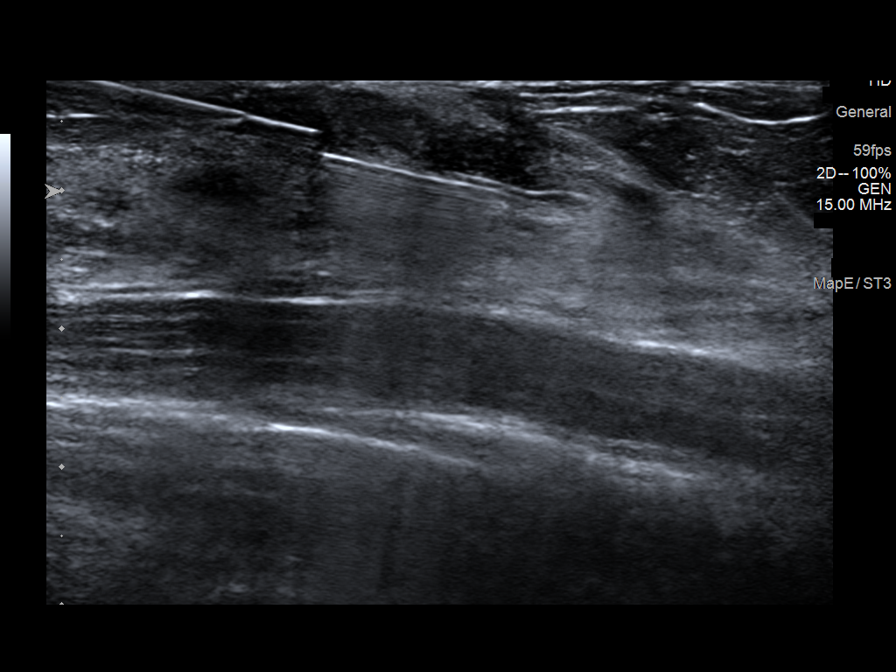
[im 7/8]
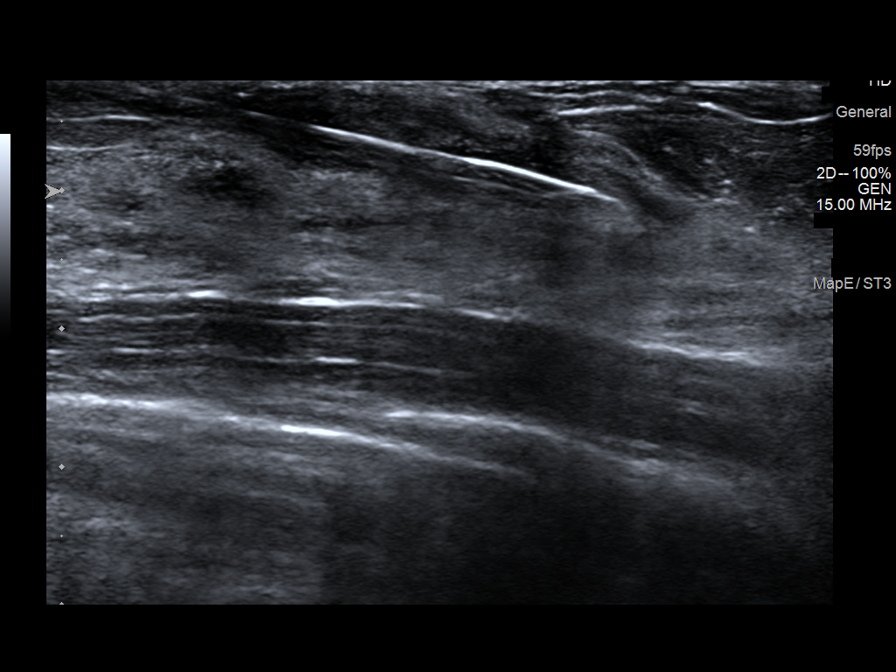
[im 8/8]
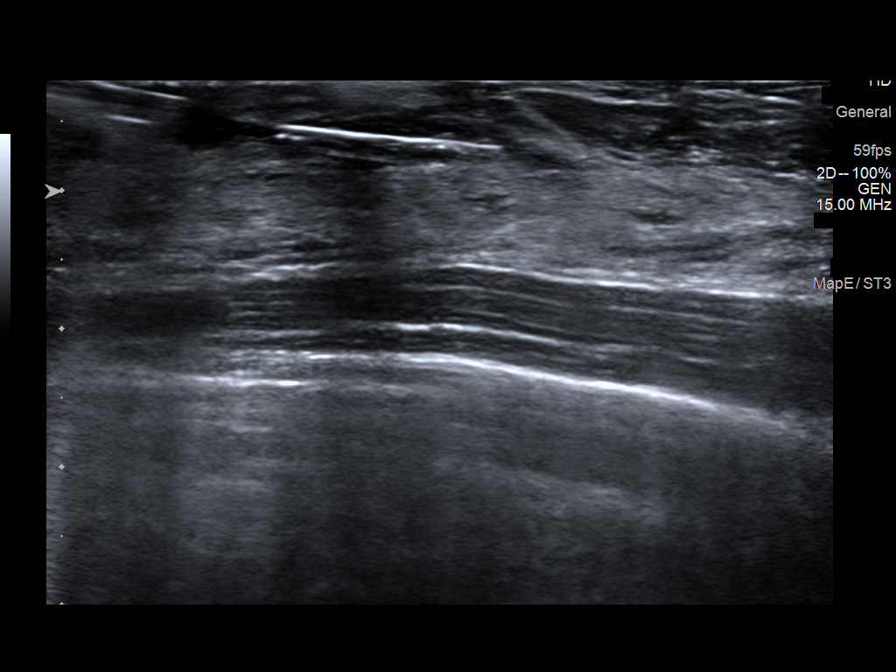

[8 of 8 positions shown; findings below may reference images not displayed]



Lesion quadrant: Lower outer quadrant

Using sterile technique and 1% Lidocaine as local anesthetic, under
direct ultrasound visualization, a 14 gauge Bhanda device was
used to perform biopsy of of an intraductal mass in the retroareolar
left breast using a medial approach. At the conclusion of the
procedure tribell tissue marker clip was deployed into the biopsy
cavity. Follow up 2 view mammogram was deferred at this time due to
the patient's age.
IMPRESSION: Ultrasound guided biopsy of left breast.  No apparent complications.

ADDENDUM:
Pathology revealed FIBROCYSTIC CHANGE of the Left breast, 6:00
o'clock, retroareolar, (tribell clip). This was found to be
concordant by Dr. Kalpesh Alkhafaji.

Pathology results were discussed with the patient by telephone. The
patient reported doing well after the biopsy with tenderness at the
site. Post biopsy instructions and care were reviewed and questions
were answered. The patient was encouraged to call The [REDACTED] for any additional concerns. My direct phone
number was provided.

The patient was instructed to continue with monthly self breast
examinations, clinical follow-up as needed for any new left breast
discharge, and to return for annual mammography at 42, unless
instructed otherwise.

Pathology results reported by Elidijus Liberis, RN on 06/21/2021.



Lesion quadrant: Lower outer quadrant

Using sterile technique and 1% Lidocaine as local anesthetic, under
direct ultrasound visualization, a 14 gauge Bhanda device was
used to perform biopsy of of an intraductal mass in the retroareolar
left breast using a medial approach. At the conclusion of the
procedure tribell tissue marker clip was deployed into the biopsy
cavity. Follow up 2 view mammogram was deferred at this time due to
the patient's age.
IMPRESSION: Ultrasound guided biopsy of left breast.  No apparent complications.
